# Patient Record
Sex: Female | Born: 1980
Health system: Southern US, Community
[De-identification: ages and names within clinical notes are randomized; demographics above are authoritative.]

---

## 2018-08-27 ENCOUNTER — Emergency Department (HOSPITAL_COMMUNITY): Payer: Managed Care, Other (non HMO)

## 2018-08-27 ENCOUNTER — Encounter (HOSPITAL_COMMUNITY): Payer: Self-pay

## 2018-08-27 ENCOUNTER — Emergency Department (HOSPITAL_COMMUNITY)
Admission: EM | Admit: 2018-08-27 | Discharge: 2018-08-27 | Disposition: A | Discharge status: Home / Self Care | Payer: Managed Care, Other (non HMO) | Attending: Emergency Medicine | Admitting: Emergency Medicine

## 2018-08-27 ENCOUNTER — Other Ambulatory Visit: Payer: Self-pay

## 2018-08-27 DIAGNOSIS — Z79899 Other long term (current) drug therapy: Secondary | ICD-10-CM | POA: Insufficient documentation

## 2018-08-27 DIAGNOSIS — B9689 Other specified bacterial agents as the cause of diseases classified elsewhere: Secondary | ICD-10-CM | POA: Insufficient documentation

## 2018-08-27 DIAGNOSIS — R739 Hyperglycemia, unspecified: Secondary | ICD-10-CM | POA: Diagnosis not present

## 2018-08-27 DIAGNOSIS — N760 Acute vaginitis: Secondary | ICD-10-CM | POA: Diagnosis not present

## 2018-08-27 DIAGNOSIS — R1031 Right lower quadrant pain: Secondary | ICD-10-CM | POA: Insufficient documentation

## 2018-08-27 DIAGNOSIS — R102 Pelvic and perineal pain: Secondary | ICD-10-CM | POA: Diagnosis present

## 2018-08-27 LAB — URINALYSIS, ROUTINE W REFLEX MICROSCOPIC
Bilirubin Urine: NEGATIVE
Glucose, UA: NEGATIVE mg/dL
Ketones, ur: 80 mg/dL — AB
Leukocytes,Ua: NEGATIVE
Nitrite: NEGATIVE
Protein, ur: NEGATIVE mg/dL
Specific Gravity, Urine: 1.014 (ref 1.005–1.030)
pH: 6 (ref 5.0–8.0)

## 2018-08-27 LAB — CBC WITH DIFFERENTIAL/PLATELET
Abs Immature Granulocytes: 0.21 10*3/uL — ABNORMAL HIGH (ref 0.00–0.07)
Basophils Absolute: 0 10*3/uL (ref 0.0–0.1)
Basophils Relative: 0 %
Eosinophils Absolute: 0 10*3/uL (ref 0.0–0.5)
Eosinophils Relative: 0 %
HCT: 35.5 % — ABNORMAL LOW (ref 36.0–46.0)
Hemoglobin: 11.3 g/dL — ABNORMAL LOW (ref 12.0–15.0)
Immature Granulocytes: 1 %
Lymphocytes Relative: 6 %
Lymphs Abs: 1.2 10*3/uL (ref 0.7–4.0)
MCH: 28.2 pg (ref 26.0–34.0)
MCHC: 31.8 g/dL (ref 30.0–36.0)
MCV: 88.5 fL (ref 80.0–100.0)
Monocytes Absolute: 2 10*3/uL — ABNORMAL HIGH (ref 0.1–1.0)
Monocytes Relative: 10 %
Neutro Abs: 17.4 10*3/uL — ABNORMAL HIGH (ref 1.7–7.7)
Neutrophils Relative %: 83 %
Platelets: 266 10*3/uL (ref 150–400)
RBC: 4.01 MIL/uL (ref 3.87–5.11)
RDW: 14.1 % (ref 11.5–15.5)
WBC: 20.8 10*3/uL — ABNORMAL HIGH (ref 4.0–10.5)
nRBC: 0 % (ref 0.0–0.2)

## 2018-08-27 LAB — COMPREHENSIVE METABOLIC PANEL
ALT: 16 U/L (ref 0–44)
AST: 22 U/L (ref 15–41)
Albumin: 3.1 g/dL — ABNORMAL LOW (ref 3.5–5.0)
Alkaline Phosphatase: 51 U/L (ref 38–126)
Anion gap: 9 (ref 5–15)
BUN: 7 mg/dL (ref 6–20)
CO2: 20 mmol/L — ABNORMAL LOW (ref 22–32)
Calcium: 7.9 mg/dL — ABNORMAL LOW (ref 8.9–10.3)
Chloride: 106 mmol/L (ref 98–111)
Creatinine, Ser: 0.62 mg/dL (ref 0.44–1.00)
GFR calc Af Amer: >60 mL/min (ref >60)
GFR calc non Af Amer: >60 mL/min (ref >60)
Glucose, Bld: 117 mg/dL — ABNORMAL HIGH (ref 70–99)
Potassium: 3.4 mmol/L — ABNORMAL LOW (ref 3.5–5.1)
Sodium: 135 mmol/L (ref 135–145)
Total Bilirubin: 1.5 mg/dL — ABNORMAL HIGH (ref 0.3–1.2)
Total Protein: 7.4 g/dL (ref 6.5–8.1)

## 2018-08-27 LAB — I-STAT BETA HCG BLOOD, ED (MC, WL, AP ONLY): I-stat hCG, quantitative: 11.7 m[IU]/mL — ABNORMAL HIGH (ref <5)

## 2018-08-27 LAB — WET PREP, GENITAL
Sperm: NONE SEEN
Trich, Wet Prep: NONE SEEN
Yeast Wet Prep HPF POC: NONE SEEN

## 2018-08-27 LAB — HCG, QUANTITATIVE, PREGNANCY: hCG, Beta Chain, Quant, S: <1 m[IU]/mL (ref <5)

## 2018-08-27 MED ORDER — ONDANSETRON HCL 4 MG/2ML IJ SOLN
4.0000 mg | Freq: Once | INTRAMUSCULAR | Status: AC
  Administered 2018-08-27: 4 mg via INTRAVENOUS
  Filled 2018-08-27: qty 2

## 2018-08-27 MED ORDER — HYDROMORPHONE HCL 1 MG/ML IJ SOLN
1.0000 mg | Freq: Once | INTRAMUSCULAR | Status: AC
  Administered 2018-08-27: 13:00:00 1 mg via INTRAVENOUS
  Filled 2018-08-27: qty 1

## 2018-08-27 MED ORDER — SODIUM CHLORIDE (PF) 0.9 % IJ SOLN
INTRAMUSCULAR | Status: AC
  Filled 2018-08-27: qty 50

## 2018-08-27 MED ORDER — SODIUM CHLORIDE 0.9 % IV BOLUS
1000.0000 mL | Freq: Once | INTRAVENOUS | Status: AC
  Administered 2018-08-27: 1000 mL via INTRAVENOUS

## 2018-08-27 MED ORDER — STERILE WATER FOR INJECTION IJ SOLN
INTRAMUSCULAR | Status: AC
  Administered 2018-08-27: 2.1 mL
  Filled 2018-08-27: qty 10

## 2018-08-27 MED ORDER — MORPHINE SULFATE (PF) 4 MG/ML IV SOLN
4.0000 mg | Freq: Once | INTRAVENOUS | Status: AC
  Administered 2018-08-27: 4 mg via INTRAVENOUS
  Filled 2018-08-27: qty 1

## 2018-08-27 MED ORDER — DOXYCYCLINE HYCLATE 100 MG PO TABS
100.0000 mg | ORAL_TABLET | Freq: Once | ORAL | Status: AC
  Administered 2018-08-27: 13:00:00 100 mg via ORAL
  Filled 2018-08-27: qty 1

## 2018-08-27 MED ORDER — ONDANSETRON 4 MG PO TBDP: 4.0000 mg | ORAL_TABLET | Freq: Three times a day (TID) | ORAL | 0 refills | Status: AC | PRN

## 2018-08-27 MED ORDER — DOXYCYCLINE HYCLATE 100 MG PO CAPS: 100.0000 mg | ORAL_CAPSULE | Freq: Two times a day (BID) | ORAL | 0 refills | Status: AC

## 2018-08-27 MED ORDER — IOHEXOL 300 MG/ML  SOLN
100.0000 mL | Freq: Once | INTRAMUSCULAR | Status: AC | PRN
  Administered 2018-08-27: 100 mL via INTRAVENOUS

## 2018-08-27 MED ORDER — HYDROCODONE-ACETAMINOPHEN 5-325 MG PO TABS: 1.0000 | ORAL_TABLET | ORAL | 0 refills | Status: AC | PRN

## 2018-08-27 MED ORDER — CEFTRIAXONE SODIUM 1 G IJ SOLR
1.0000 g | Freq: Once | INTRAMUSCULAR | Status: AC
  Administered 2018-08-27: 1 g via INTRAMUSCULAR
  Filled 2018-08-27: qty 10

## 2018-08-27 MED ORDER — METRONIDAZOLE 500 MG PO TABS: 500.0000 mg | ORAL_TABLET | Freq: Two times a day (BID) | ORAL | 0 refills | Status: AC

## 2018-08-27 NOTE — ED Notes (Signed)
Pt d/c home per MD order. Discharge summary reviewed with pt, pt verbalizes understanding. Voicing no complaints at discharge, Reports she is calling an Mel[facility] Popper for way home.

## 2018-08-27 NOTE — ED Notes (Signed)
Pt encouraged to provide urine specimen per MD order.  

## 2018-08-27 NOTE — ED Provider Notes (Addendum)
[facility] DEPT Provider Note   CSN: [id number] Arrival date & time: 08/27/18  0522  History   Chief Complaint Chief Complaint  Patient presents with   Pelvic Pain   HPI Amy Chen is a 38 y.o. female with no significant past medical history who presents for evaluation of pelvic pain.  Patient states she has had right lower quadrant pain as well as some mild suprapubic pain over the last 2 days.  She has been taking ibuprofen without relief of her symptoms.  Symptoms have progressively worsened.  They are nonradiating.  Has had some mild nausea without emesis.  Denies fever, chills, chest pain, shortness of breath, congestion, rhinorrhea, dysuria, diarrhea, constipation, vaginal discharge, concerns for STDs.  She was last sexually active in March.  She does use protection.  Per triage note nursing states she had a prior hysterectomy however patient denies this.  She denies any prior abdominal surgeries.  Rates her current pain a 10/10.  Described as throbbing, aching, stabbing.  Denies additional aggravating or alleviating factors.  Prior Abd surgeries-- None Last PO intake solid food-- 8 PM yesterday   History obtained from patient and past medical records.  Interpreter was used.     HPI  History reviewed. No pertinent past medical history.  There are no active problems to display for this patient.   History reviewed. No pertinent surgical history.   OB History   No obstetric history on file.      Home Medications    Prior to Admission medications   Medication Sig Start Date End Date Taking? Authorizing Provider  acetaminophen (TYLENOL) 325 MG tablet Take 975 mg by mouth every 6 (six) hours as needed for mild pain, moderate pain or headache.   Yes [provider]  ibuprofen (ADVIL) 200 MG tablet Take 400 mg by mouth every 6 (six) hours as needed for fever, headache or moderate pain.   Yes [provider]  Multiple  Vitamin (MULTIVITAMIN WITH MINERALS) TABS tablet Take 1 tablet by mouth daily.   Yes [provider]  doxycycline (VIBRAMYCIN) 100 MG capsule Take 1 capsule (100 mg total) by mouth 2 (two) times daily for 14 days. 08/27/18 09/10/18  Kattia Selley A, PA-C  HYDROcodone-acetaminophen (NORCO/VICODIN) 5-325 MG tablet Take 1 tablet by mouth every 4 (four) hours as needed. 08/27/18   Khadijatou Borak A, PA-C  metroNIDAZOLE (FLAGYL) 500 MG tablet Take 1 tablet (500 mg total) by mouth 2 (two) times daily. 08/27/18   Welles Walthall A, PA-C  ondansetron (ZOFRAN ODT) 4 MG disintegrating tablet Take 1 tablet (4 mg total) by mouth every 8 (eight) hours as needed for nausea or vomiting. 08/27/18   Lucciana Head A, PA-C    Family History No family history on file.  Social History Social History   Tobacco Use   Smoking status: Not on file  Substance Use Topics   Alcohol use: Not on file   Drug use: Not on file     Allergies   Patient has no known allergies.   Review of Systems Review of Systems  Constitutional: Negative.   HENT: Negative.   Respiratory: Negative.   Cardiovascular: Negative.   Gastrointestinal: Positive for abdominal pain and nausea. Negative for anal bleeding, blood in stool, constipation, diarrhea, rectal pain and vomiting.  Genitourinary: Positive for pelvic pain. Negative for decreased urine volume, difficulty urinating, dysuria, flank pain, frequency, hematuria, menstrual problem, urgency, vaginal bleeding, vaginal discharge and vaginal pain.  Musculoskeletal: Negative.  Skin: Negative.   Neurological: Negative.   All other systems reviewed and are negative.   Physical Exam Updated Vital Signs BP (!) 106/53    Pulse 86    Temp 98.2 F (36.8 C) (Oral)    Resp 16    Ht 5\' 7"  (1.702 m)    Wt 127 kg    LMP 07/25/2018    SpO2 99%    BMI 43.85 kg/m   Physical Exam Vitals signs and nursing note reviewed.  Constitutional:      General: She is not in acute  distress.    Appearance: She is well-developed. She is not ill-appearing, toxic-appearing or diaphoretic.  HENT:     Head: Normocephalic and atraumatic.     Nose: Nose normal.     Mouth/Throat:     Mouth: Mucous membranes are moist.     Pharynx: Oropharynx is clear.  Eyes:     Pupils: Pupils are equal, round, and reactive to light.  Neck:     Musculoskeletal: Normal range of motion.  Cardiovascular:     Rate and Rhythm: Normal rate.     Pulses: Normal pulses.          Dorsalis pedis pulses are 2+ on the right side and 2+ on the left side.       Posterior tibial pulses are 2+ on the right side and 2+ on the left side.     Heart sounds: Normal heart sounds.  Pulmonary:     Effort: Pulmonary effort is normal. No respiratory distress.     Breath sounds: Normal breath sounds.  Abdominal:     General: Bowel sounds are normal. There is no distension.     Palpations: Abdomen is soft.     Tenderness: There is abdominal tenderness in the right lower quadrant and suprapubic area. There is no right CVA tenderness, left CVA tenderness, guarding or rebound. Negative signs include Murphy's sign, Rovsing's sign, McBurney's sign, psoas sign and obturator sign.     Hernia: No hernia is present.       Comments: Soft without rebound or guarding.  She has tenderness to right lower quadrant and suprapubic region. Negative psoas, obturator sign. No tenderness at [place]. No overlying skin changes.  Genitourinary:    Comments: Normal appearing external female genitalia without rashes or lesions, normal vaginal epithelium. Normal appearing cervix with mild thin white discharge in vaginal vault. No cervical petechiae or friable cervix. Cervical os is closed. There is no bleeding noted at the os. No Odor. Bimanual: Mild CMT, mild right adenexal tenderness, No left adenexal tenderness. Fibroid type uterus.  No palpable adnexal masses or tenderness. Uterus midline and not fixed. Rectovaginal exam was  deferred.  No cystocele or rectocele noted. No pelvic lymphadenopathy noted. Wet prep was obtained.  Cultures for gonorrhea and chlamydia collected. Exam performed with chaperone, Nurse  in room. Musculoskeletal: Normal range of motion.     Comments: Moves all 4 extremities no difficulty.  Skin:    General: Skin is warm and dry.     Comments: No rashes or lesions.  No erythema, warmth or edema.  Neurological:     Mental Status: She is alert.     Comments: Cranial nerves II through XII grossly intact.  No facial droop.  Ambulatory that difficulty.    ED Treatments / Results  Labs (all labs ordered are listed, but only abnormal results are displayed) Labs Reviewed  WET PREP, GENITAL - Abnormal; Notable for the following components:  Result Value   Clue Cells Wet Prep HPF POC PRESENT (*)    WBC, Wet Prep HPF POC FEW (*)    All other components within normal limits  CBC WITH DIFFERENTIAL/PLATELET - Abnormal; Notable for the following components:   WBC 20.8 (*)    Hemoglobin 11.3 (*)    HCT 35.5 (*)    Neutro Abs 17.4 (*)    Monocytes Absolute 2.0 (*)    Abs Immature Granulocytes 0.21 (*)    All other components within normal limits  COMPREHENSIVE METABOLIC PANEL - Abnormal; Notable for the following components:   Potassium 3.4 (*)    CO2 20 (*)    Glucose, Bld 117 (*)    Calcium 7.9 (*)    Albumin 3.1 (*)    Total Bilirubin 1.5 (*)    All other components within normal limits  URINALYSIS, ROUTINE W REFLEX MICROSCOPIC - Abnormal; Notable for the following components:   Hgb urine dipstick SMALL (*)    Ketones, ur 80 (*)    Bacteria, UA RARE (*)    All other components within normal limits  I-STAT BETA HCG BLOOD, ED ([facility], [facility], [facility] ONLY) - Abnormal; Notable for the following components:   I-stat hCG, quantitative 11.7 (*)    All other components within normal limits  URINE CULTURE  HCG, QUANTITATIVE, PREGNANCY  RPR  HIV ANTIBODY (ROUTINE TESTING W REFLEX)  GC/CHLAMYDIA PROBE  AMP ([place]) NOT AT [facility]    EKG None  Radiology [place] Transvaginal Non-ob  Result Date: 08/27/2018 CLINICAL DATA:  RIGHT pelvic pain EXAM: TRANSABDOMINAL AND TRANSVAGINAL ULTRASOUND OF PELVIS DOPPLER ULTRASOUND OF OVARIES TECHNIQUE: Both transabdominal and transvaginal ultrasound examinations of the pelvis were performed. Transabdominal technique was performed for global imaging of the pelvis including uterus, ovaries, adnexal regions, and pelvic cul-de-sac. It was necessary to proceed with endovaginal exam following the transabdominal exam to visualize the RIGHT ovary and endometrium. Color and duplex Doppler ultrasound was utilized to evaluate blood flow to the ovaries. COMPARISON:  None FINDINGS: Uterus Measurements: 12.2 x 8.8 x 7.8 cm = volume: 441 mL. Large mass at uterine fundus 7.5 x 7.3 x 7.4 cm likely representing a heterogeneous leiomyoma. This is transmural and extends submucosal at the upper uterine segment. Endometrium Thickness: 4 mm.  No endometrial fluid or focal abnormality Right ovary Measurements: 2.5 x 3.7 x 1.5 cm = volume: 7.4 mL. Normal morphology without mass. Internal blood flow present on color Doppler imaging. Left ovary No normal appearing LEFT ovary visualized. LEFT adnexal mass identified, see below. Pulsed Doppler evaluation of the RIGHT ovary demonstrates normal low-resistance arterial and venous waveforms. Other findings No abnormal free fluid. Complex mass identified in LEFT adnexa, 6.3 x 6.2 x 6.6 cm in size, containing a large hyperechoic nodular focus 4.7 cm greatest diameter and surrounding complex hypoechoic regions. 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 No normal appearing LEFT ovarian tissue is visualized. Blood flow is detected within the margin of the mass on color Doppler imaging, with arterial and venous waveforms identified on pulsed Doppler imaging. No additional pelvic masses. IMPRESSION: Large probable transmural leiomyoma at  uterine fundus 7.5 cm greatest size. Normal appearing RIGHT ovary without evidence of ovarian torsion. 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 Electronically Signed   By: Ulyses SouthwardMark  Boles M.D.   On: 08/27/2018 11:18   [place] Pelvis Complete  Result Date:  08/27/2018 CLINICAL DATA:  RIGHT pelvic pain EXAM: TRANSABDOMINAL AND TRANSVAGINAL ULTRASOUND OF PELVIS DOPPLER ULTRASOUND OF OVARIES TECHNIQUE: Both transabdominal and transvaginal ultrasound examinations of the pelvis were performed. Transabdominal technique was performed for global imaging of the pelvis including uterus, ovaries, adnexal regions, and pelvic cul-de-sac. It was necessary to proceed with endovaginal exam following the transabdominal exam to visualize the RIGHT ovary and endometrium. Color and duplex Doppler ultrasound was utilized to evaluate blood flow to the ovaries. COMPARISON:  None FINDINGS: Uterus Measurements: 12.2 x 8.8 x 7.8 cm = volume: 441 mL. Large mass at uterine fundus 7.5 x 7.3 x 7.4 cm likely representing a heterogeneous leiomyoma. This is transmural and extends submucosal at the upper uterine segment. Endometrium Thickness: 4 mm.  No endometrial fluid or focal abnormality Right ovary Measurements: 2.5 x 3.7 x 1.5 cm = volume: 7.4 mL. Normal morphology without mass. Internal blood flow present on color Doppler imaging. Left ovary No normal appearing LEFT ovary visualized. LEFT adnexal mass identified, see below. Pulsed Doppler evaluation of the RIGHT ovary demonstrates normal low-resistance arterial and venous waveforms. Other findings No abnormal free fluid. Complex mass identified in LEFT adnexa, 6.3 x 6.2 x 6.6 cm in size, containing a large hyperechoic nodular focus 4.7 cm greatest diameter and surrounding complex hypoechoic regions. 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 No normal appearing LEFT  ovarian tissue is visualized. Blood flow is detected within the margin of the mass on color Doppler imaging, with arterial and venous waveforms identified on pulsed Doppler imaging. No additional pelvic masses. IMPRESSION: Large probable transmural leiomyoma at uterine fundus 7.5 cm greatest size. Normal appearing RIGHT ovary without evidence of ovarian torsion. 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 Electronically Signed   By: Ulyses SouthwardMark  Boles M.D.   On: 08/27/2018 11:18   Ct Abdomen Pelvis W Contrast  Result Date: 08/27/2018 CLINICAL DATA:  Abdominal pain, primarily lower abdomen EXAM: CT ABDOMEN AND PELVIS WITH CONTRAST TECHNIQUE: Multidetector CT imaging of the abdomen and pelvis was performed using the standard protocol following bolus administration of intravenous contrast. CONTRAST:  100mL OMNIPAQUE IOHEXOL 300 MG/ML  SOLN COMPARISON:  None. FINDINGS: Lower chest: There is mild bibasilar atelectasis. There is no lung base edema or consolidation. Hepatobiliary: No focal liver lesions are evident. Gallbladder wall is not appreciably thickened. There is no biliary duct dilatation. Pancreas: There is no pancreatic mass or inflammatory focus. Spleen: No splenic lesions are evident. Adrenals/Urinary Tract: Adrenals bilaterally appear normal. Kidneys bilaterally show no evident mass or hydronephrosis on either side. There is a 2 mm calculus in the upper pole of the left kidney. No intrarenal calculi are noted on the right. There is no evident ureteral calculus on either side. Urinary bladder is midline with wall thickness within normal limits. There is slight prominence of the mid right ureter due to right ureter being impressed upon by a large uterine mass. Stomach/Bowel: There is no appreciable bowel wall or mesenteric thickening. There is no evident bowel obstruction. Terminal ileum appears normal.  There is no evident free air or portal venous air. Vascular/Lymphatic: No abdominal aortic aneurysm. No vascular lesions evident. There are scattered subcentimeter retroperitoneal lymph nodes. No frank adenopathy is evident by size criteria on this study. Reproductive: Uterus is anteverted. There is a mass arising from the uterus which has a decreased attenuation central region with a thickened wall slightly  enhancing periphery. This dominant mass arising from the uterus measures 10.8 x 9.3 x 8.7 cm. There is an apparent dermoid arising from the left adnexa containing fat and non-specific soft tissue material. This left ovarian dermoid measures 6.8 x 6.4 x 6.3 cm. No other pelvic masses are evident. Other: The appendix appears unremarkable. No abscess or ascites is evident in the abdomen or pelvis. There is a lipoma immediately superior to the umbilicus in the anterior abdominal wall measuring 2.9 x 2.6 x 4.3 cm. Musculoskeletal: No blastic or lytic bone lesions. No intramuscular lesions are evident. IMPRESSION: 1. Dominant mass arising from the anterior uterus measuring 10.8 x 9.3 x 8.7 cm. This mass has a decreased attenuation central region with an enhancing thick-walled peripheral region. Suspect slightly atypical leiomyoma. 2. Left ovarian dermoid measuring 6.8 x 6.4 x 6.3 cm, containing fat and nonspecific solid material. 3. Appendix appears normal. No bowel obstruction. No abscess in the abdomen or pelvis. 4. 2 mm calculus upper pole left kidney. No hydronephrosis or ureteral calculus on either side. Note that the right ureter is impressed upon by the dominant uterine mass. Urinary bladder wall thickness normal. 5. Lipoma in the anterior abdominal wall slightly superior to the umbilicus measuring 2.9 x 2.6 x 4.3 cm. Comment: It may be prudent to correlate with pelvic ultrasound including Doppler interrogation to exclude the possibility of torsion as a differential consideration for the patient's pain.  Electronically Signed   By: Bretta Bang III M.D.   On: 08/27/2018 11:30   [place] Art/ven Flow Abd Pelv Doppler  Result Date: 08/27/2018 CLINICAL DATA:  RIGHT pelvic pain EXAM: TRANSABDOMINAL AND TRANSVAGINAL ULTRASOUND OF PELVIS DOPPLER ULTRASOUND OF OVARIES TECHNIQUE: Both transabdominal and transvaginal ultrasound examinations of the pelvis were performed. Transabdominal technique was performed for global imaging of the pelvis including uterus, ovaries, adnexal regions, and pelvic cul-de-sac. It was necessary to proceed with endovaginal exam following the transabdominal exam to visualize the RIGHT ovary and endometrium. Color and duplex Doppler ultrasound was utilized to evaluate blood flow to the ovaries. COMPARISON:  None FINDINGS: Uterus Measurements: 12.2 x 8.8 x 7.8 cm = volume: 441 mL. Large mass at uterine fundus 7.5 x 7.3 x 7.4 cm likely representing a heterogeneous leiomyoma. This is transmural and extends submucosal at the upper uterine segment. Endometrium Thickness: 4 mm.  No endometrial fluid or focal abnormality Right ovary Measurements: 2.5 x 3.7 x 1.5 cm = volume: 7.4 mL. Normal morphology without mass. Internal blood flow present on color Doppler imaging. Left ovary No normal appearing LEFT ovary visualized. LEFT adnexal mass identified, see below. Pulsed Doppler evaluation of the RIGHT ovary demonstrates normal low-resistance arterial and venous waveforms. Other findings No abnormal free fluid. Complex mass identified in LEFT adnexa, 6.3 x 6.2 x 6.6 cm in size, containing a large hyperechoic nodular focus 4.7 cm greatest diameter and surrounding complex hypoechoic regions. Legrand Rams this representing a large dermoid tumor of the LEFT ovary. No normal appearing LEFT ovarian tissue is visualized. Blood flow is detected within the margin of the mass on color Doppler imaging, with arterial and venous waveforms identified on pulsed Doppler imaging. No additional pelvic masses. IMPRESSION: Large  probable transmural leiomyoma at uterine fundus 7.5 cm greatest size. Normal appearing RIGHT ovary without evidence of ovarian torsion. 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 Electronically Signed   By: Ulyses Southward M.D.   On: 08/27/2018  11:18    Procedures Procedures (including critical care time)  Medications Ordered in ED Medications  sodium chloride (PF) 0.9 % injection (has no administration in time range)  sodium chloride 0.9 % bolus 1,000 mL (0 mLs Intravenous Stopped 08/27/18 0900)  ondansetron (ZOFRAN) injection 4 mg (4 mg Intravenous Given 08/27/18 0801)  morphine 4 MG/ML injection 4 mg (4 mg Intravenous Given 08/27/18 0802)  iohexol (OMNIPAQUE) 300 MG/ML solution 100 mL (100 mLs Intravenous Contrast Given 08/27/18 1108)  HYDROmorphone (DILAUDID) injection 1 mg (1 mg Intravenous Given 08/27/18 1235)  ondansetron (ZOFRAN) injection 4 mg (4 mg Intravenous Given 08/27/18 1235)  cefTRIAXone (ROCEPHIN) injection 1 g (1 g Intramuscular Given 08/27/18 1239)  doxycycline (VIBRA-TABS) tablet 100 mg (100 mg Oral Given 08/27/18 1235)  sterile water (preservative free) injection (2.1 mLs  Given 08/27/18 1240)   Initial Impression / Assessment and Plan / ED Course  I have reviewed the triage vital signs and the nursing notes.  Pertinent labs & imaging results that were available during my care of the patient were reviewed by me and considered in my medical decision making (see chart for details).  38 year old female appears otherwise well presents for evaluation of right-sided pelvic pain x 2-3 days.  Mildly elevated oral temp on arrival to 100.0 however patient states she had just taken "a few sips of coffee" right before temperature check.  I personally evaluated patient 30 minutes later and oral temperature was 98.5 without any medications. Rectal temp without elevation. Abdomen soft with  mild tenderness to right lower quadrant and suprapubic region. No urinary sx. Tolerating p.o. intake at home without difficulty.  GU exam with minimal cervical motion tenderness however she does have some mild right adnexal tenderness.  Thin white dc in vaginal vault. Will obtain labs, urine and reevaluate.  Nursing is been unable to attain labs from patient or urine at this time.  0930: I-STAT hCG came back at 11.  Discussed with patient results.  Patient states "there is no way Im pregnant  The last time he had sex was in March." Patient seems reliable. Given her low level and last intercourse may be possible false negative.  Will obtain quant hCG. Discussed with patient elevated white blood cell count and concern for possible tubo-ovarian abscess vs appendicitis.  Discussed CT risks given hCG elevated.  She voices understanding and would like to proceed with a CT scan.  Pain currently rated a 6/10.  States she does not want additional pain medicine at this time.  She will call out if needed  Labs and imaging personally reviewed:  CBC with leukocytosis at 20.8, Hgb 11.3 Wet prep with BV, few WBC CMP with mild hypokalemia, elevated glucose, mildly elevated bili however no RUQ pain. No additional LFT elevation  HCg quant: Less than 0--negative (Istat originally positive at 11.7) RPR/HIV pending CG/Chlamydia: pending Urinalysis : negative for infection, will culture  [place]: left cystic lesion and fibroids, No RIGHT pathology, No torsion, TOA CT [facility]: Without acute findings, atypical leiomyoma at uterus, No appendicitis  1200: Pain a 6/10.  Abdomen soft without rebound or guarding.  Very minimal tenderness currently to the right lower quadrant, right pelvis.  1345:Hemodynamically stable.  Only minimal pain currently however non surgical abdomen.  Negative psoas, obturator and Rovsing sign. No RUQ pain to suggest gallbladder pathology. Tolerating p.o. intake without difficulty. Ambulating without  difficulty.  Discussed with patient possible PID versus early appendicitis not found on CT scan. Low suspicion for ovarian torsion (intermittent  torsion as well considered), TOA, appy. Will DC home with antibiotics, pain medicine to cover for possible PID give mild CMT and right adnexal tenderness.  Patient to return for any new or worsening symptoms, specifically to the worsening to the right lower quadrant, fever, emesis.  No evidence of torsion, ovarian cyst, appendicitis, bowel obstruction or perforation, TOA on imaging currently.  Patient is nontoxic, nonseptic appearing, in no apparent distress.  Patient's pain and other symptoms adequately managed in emergency department.  Fluid bolus given.  Labs, imaging and vitals reviewed.  Patient does not meet the SIRS or Sepsis criteria.  On repeat exam patient does not have a surgical abdomin and there are no peritoneal signs.  No indication of appendicitis, bowel obstruction, bowel perforation, cholecystitis, diverticulitis, PID or ectopic pregnancy.  Patient discharged home with symptomatic treatment and given strict instructions for follow-up with their primary care physician.  I have also discussed reasons to return immediately to the ER.  Patient expresses understanding and agrees with plan.        Final Clinical Impressions(s) / ED Diagnoses   Final diagnoses:  BV (bacterial vaginosis)  RLQ abdominal pain  Blood glucose elevated    ED Discharge Orders         Ordered    doxycycline (VIBRAMYCIN) 100 MG capsule  2 times daily     08/27/18 1341    metroNIDAZOLE (FLAGYL) 500 MG tablet  2 times daily     08/27/18 1341    HYDROcodone-acetaminophen (NORCO/VICODIN) 5-325 MG tablet  Every 4 hours PRN     08/27/18 1341    ondansetron (ZOFRAN ODT) 4 MG disintegrating tablet  Every 8 hours PRN     08/27/18 1341           Ceciley Buist A, PA-C 08/27/18 1346    Benjiman CorePickering, Nathan, MD 08/27/18 1511    Daniesha Driver A, PA-C 08/28/18  1647    Benjiman CorePickering, Nathan, MD 08/29/18 1505

## 2018-08-27 NOTE — ED Triage Notes (Addendum)
Pt coming from home c/o pelvic pain x2 days that has gotten progressively worse. Non-radiating. No spotting or discharge. No hx of hysterectomy, no children but states "might have had a cyst before." Last period approx 1 month ago and last BM yesterday. Taken tylenol and ibuprofen with no relief.

## 2018-08-27 NOTE — Discharge Instructions (Addendum)
Take the antibiotics as prescribed.  If you develop worsening symptoms, fever, throwing up despite Zofran please seek reevaluation.

## 2018-08-28 LAB — URINE CULTURE: Culture: <10000 — AB

## 2018-08-28 LAB — HIV ANTIBODY (ROUTINE TESTING W REFLEX): HIV Screen 4th Generation wRfx: NONREACTIVE

## 2018-08-28 LAB — RPR: RPR Ser Ql: NONREACTIVE

## 2018-08-28 LAB — GC/CHLAMYDIA PROBE AMP (~~LOC~~) NOT AT ARMC
Chlamydia: NEGATIVE
Neisseria Gonorrhea: NEGATIVE

## 2018-11-29 ENCOUNTER — Other Ambulatory Visit: Payer: Self-pay

## 2018-11-29 DIAGNOSIS — Z20822 Contact with and (suspected) exposure to covid-19: Secondary | ICD-10-CM

## 2018-12-02 LAB — NOVEL CORONAVIRUS, NAA: SARS-CoV-2, NAA: NOT DETECTED

## 2019-01-02 ENCOUNTER — Other Ambulatory Visit: Payer: Managed Care, Other (non HMO)

## 2019-01-28 ENCOUNTER — Ambulatory Visit: Payer: Managed Care, Other (non HMO) | Attending: Internal Medicine

## 2019-01-28 DIAGNOSIS — Z20822 Contact with and (suspected) exposure to covid-19: Secondary | ICD-10-CM

## 2019-01-29 LAB — NOVEL CORONAVIRUS, NAA: SARS-CoV-2, NAA: NOT DETECTED

## 2019-08-09 ENCOUNTER — Ambulatory Visit: Payer: HRSA Program | Attending: Internal Medicine

## 2019-08-09 DIAGNOSIS — Z20822 Contact with and (suspected) exposure to covid-19: Secondary | ICD-10-CM | POA: Insufficient documentation

## 2019-08-10 LAB — SARS-COV-2, NAA 2 DAY TAT

## 2019-08-10 LAB — NOVEL CORONAVIRUS, NAA: SARS-CoV-2, NAA: NOT DETECTED

## 2019-09-09 ENCOUNTER — Other Ambulatory Visit: Payer: Self-pay

## 2019-10-14 ENCOUNTER — Other Ambulatory Visit: Payer: Self-pay

## 2019-10-14 DIAGNOSIS — Z20822 Contact with and (suspected) exposure to covid-19: Secondary | ICD-10-CM

## 2019-10-15 LAB — SARS-COV-2, NAA 2 DAY TAT

## 2019-10-15 LAB — NOVEL CORONAVIRUS, NAA: SARS-CoV-2, NAA: NOT DETECTED

## 2019-10-21 ENCOUNTER — Other Ambulatory Visit: Payer: Self-pay

## 2019-10-21 DIAGNOSIS — Z20822 Contact with and (suspected) exposure to covid-19: Secondary | ICD-10-CM

## 2019-10-23 LAB — SARS-COV-2, NAA 2 DAY TAT

## 2019-10-23 LAB — NOVEL CORONAVIRUS, NAA: SARS-CoV-2, NAA: NOT DETECTED

## 2019-11-09 ENCOUNTER — Ambulatory Visit: Payer: Self-pay | Attending: Internal Medicine

## 2019-11-09 DIAGNOSIS — Z23 Encounter for immunization: Secondary | ICD-10-CM

## 2019-11-09 NOTE — Progress Notes (Signed)
° °  Covid-19 Vaccination [facility]  Name:  Amy Chen    MRN: [id number] DOB: [birth year]  11/09/2019  Amy Chen was observed post Covid-19 immunization for 15 minutes without incident. She was provided with Vaccine Information Sheet and instruction to access the V-Safe system.   Amy Chen was instructed to call [phone] with any severe reactions post vaccine:  Difficulty breathing   Swelling of face and throat   A fast heartbeat   A bad rash all over body   Dizziness and weakness

## 2019-12-02 ENCOUNTER — Other Ambulatory Visit: Payer: Self-pay

## 2019-12-30 ENCOUNTER — Other Ambulatory Visit: Payer: Self-pay

## 2019-12-30 DIAGNOSIS — Z20822 Contact with and (suspected) exposure to covid-19: Secondary | ICD-10-CM

## 2020-01-01 LAB — SARS-COV-2, NAA 2 DAY TAT

## 2020-01-01 LAB — NOVEL CORONAVIRUS, NAA: SARS-CoV-2, NAA: NOT DETECTED

## 2020-01-02 ENCOUNTER — Other Ambulatory Visit: Payer: Self-pay

## 2020-01-15 ENCOUNTER — Other Ambulatory Visit: Payer: Self-pay

## 2020-01-15 DIAGNOSIS — Z20822 Contact with and (suspected) exposure to covid-19: Secondary | ICD-10-CM

## 2020-01-16 LAB — SARS-COV-2, NAA 2 DAY TAT

## 2020-01-16 LAB — NOVEL CORONAVIRUS, NAA: SARS-CoV-2, NAA: NOT DETECTED

## 2020-01-22 ENCOUNTER — Other Ambulatory Visit: Payer: Self-pay

## 2020-01-22 DIAGNOSIS — Z20822 Contact with and (suspected) exposure to covid-19: Secondary | ICD-10-CM

## 2020-01-24 LAB — NOVEL CORONAVIRUS, NAA: SARS-CoV-2, NAA: NOT DETECTED

## 2020-01-24 LAB — SARS-COV-2, NAA 2 DAY TAT

## 2020-01-30 ENCOUNTER — Other Ambulatory Visit: Payer: Self-pay

## 2020-01-30 DIAGNOSIS — Z20822 Contact with and (suspected) exposure to covid-19: Secondary | ICD-10-CM

## 2020-02-01 LAB — SARS-COV-2, NAA 2 DAY TAT

## 2020-02-01 LAB — NOVEL CORONAVIRUS, NAA: SARS-CoV-2, NAA: NOT DETECTED

## 2020-10-15 IMAGING — CT CT ABDOMEN AND PELVIS WITH CONTRAST
1 of 3 series · 12 of 32 positions shown, 17 images · IV contrast (OMNIPAQUE 300)
Comparison: None.

CLINICAL DATA: Abdominal pain, primarily lower abdomen

EXAM:
CT ABDOMEN AND PELVIS WITH CONTRAST
TECHNIQUE: Multidetector CT imaging of the abdomen and pelvis was performed
using the standard protocol following bolus administration of
intravenous contrast.
CONTRAST:  100mL OMNIPAQUE IOHEXOL 300 MG/ML  SOLN

[Series 2: axial st · axial · 0.73mm/px · z∈[-673,-288]mm · 12 of 89 slices shown, 17 images]
[im 6/89  soft-tissue]
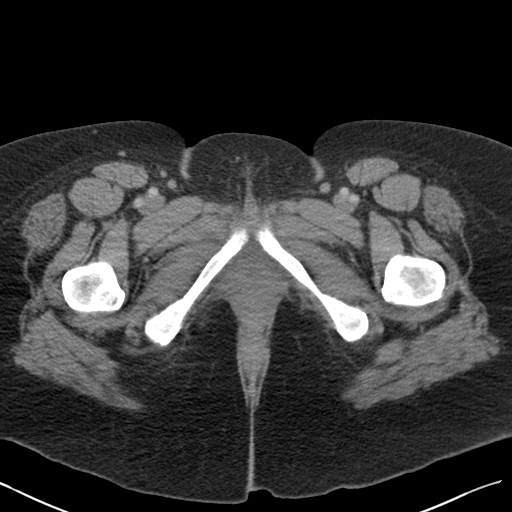
[im 6/89  bone]
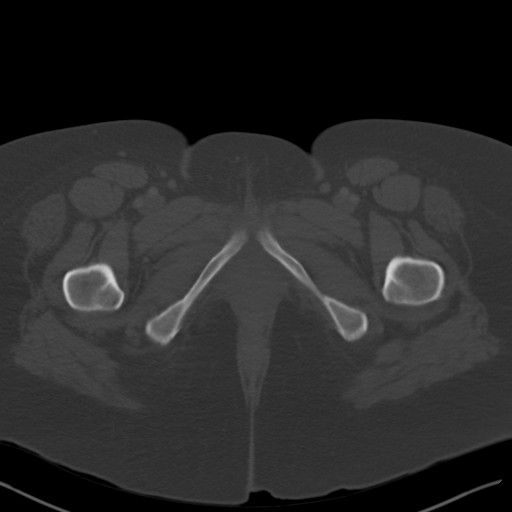
[im 17/89  soft-tissue]
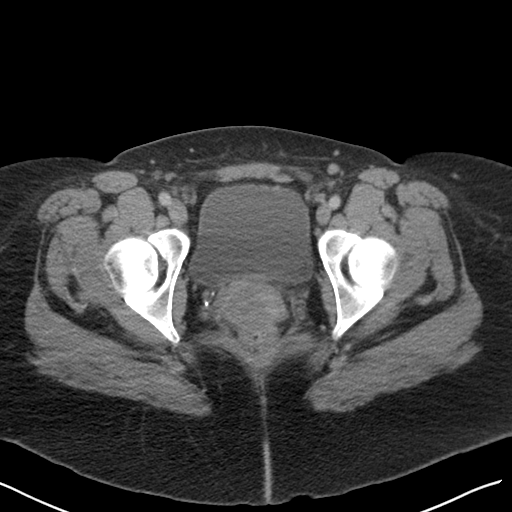
[im 23/89  soft-tissue]
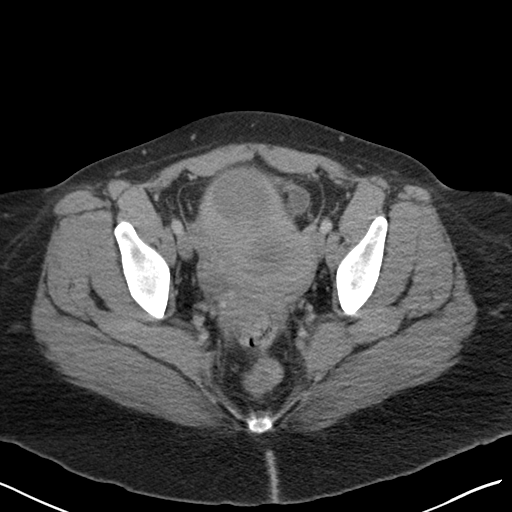
[im 28/89  soft-tissue]
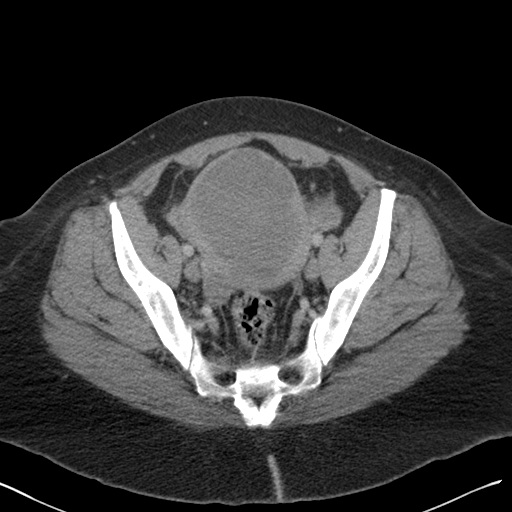
[im 39/89  soft-tissue]
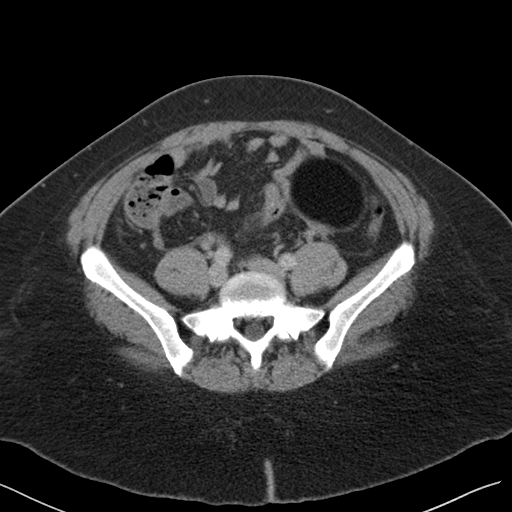
[im 45/89  soft-tissue]
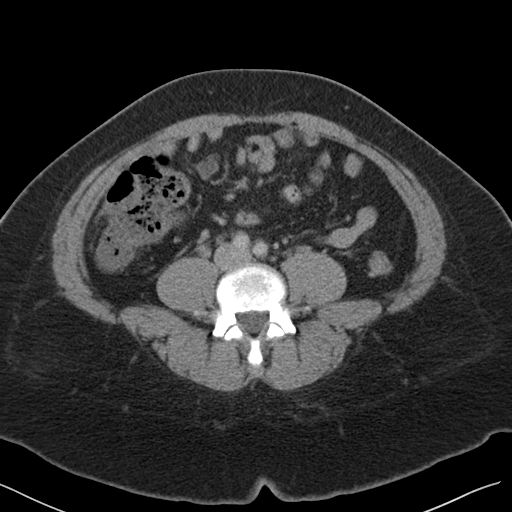
[im 50/89  soft-tissue]
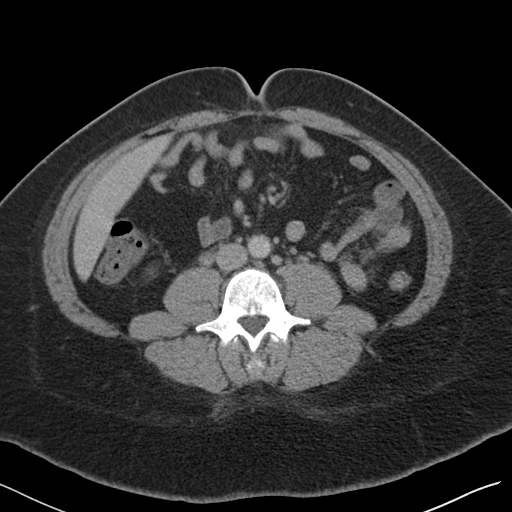
[im 61/89  soft-tissue]
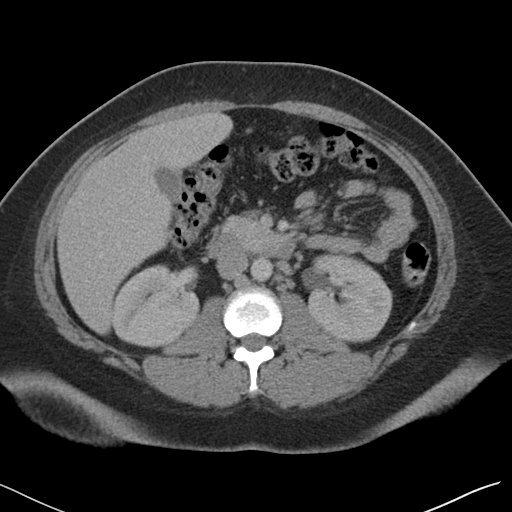
[im 67/89  soft-tissue]
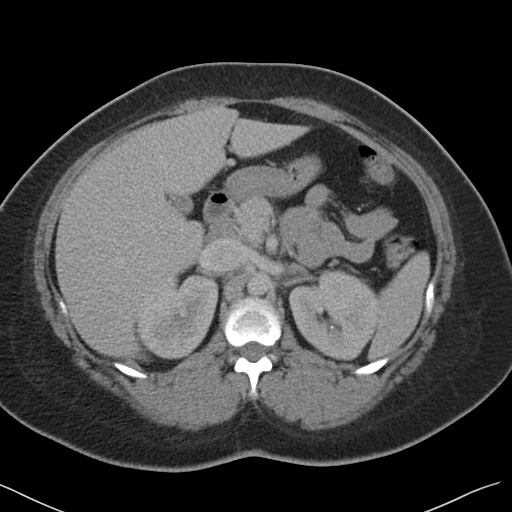
[im 67/89  lung]
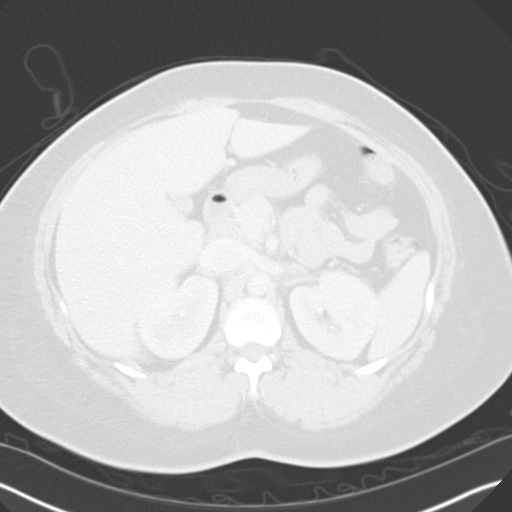
[im 67/89  bone]
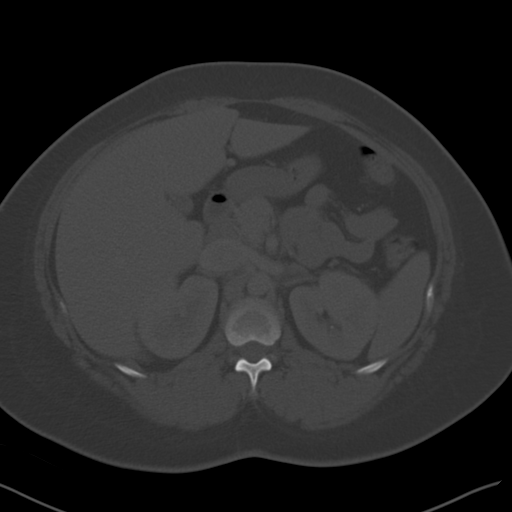
[im 72/89  soft-tissue]
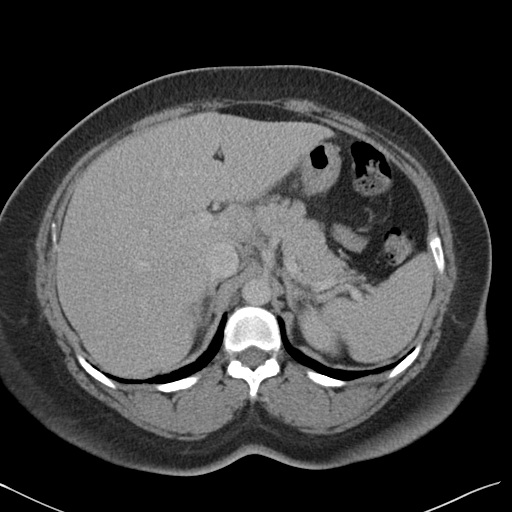
[im 72/89  lung]
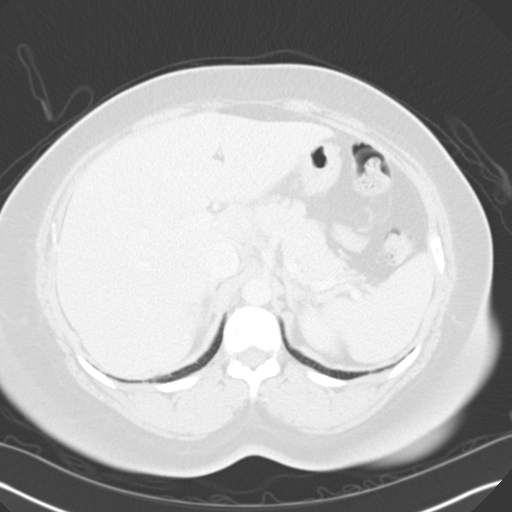
[im 78/89  lung]
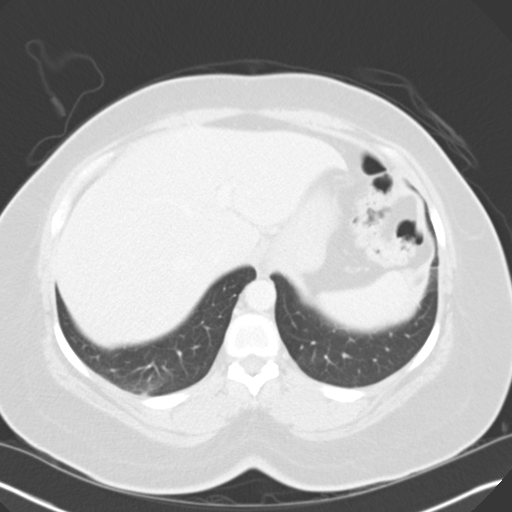
[im 83/89  soft-tissue]
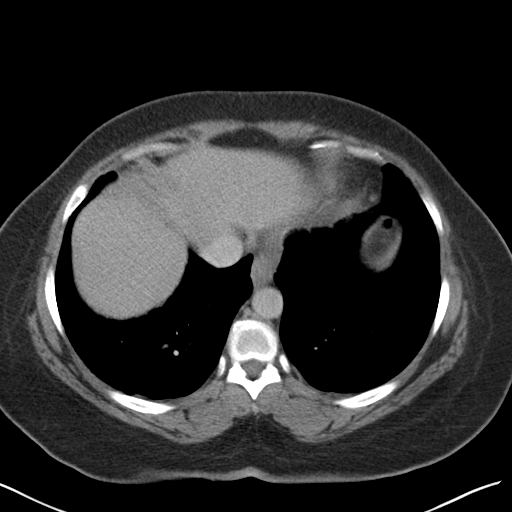
[im 83/89  lung]
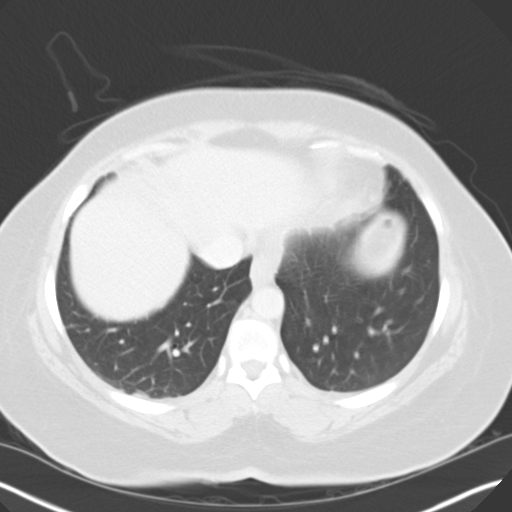

[12 of 32 positions shown; findings below may reference images not displayed]

FINDINGS: Lower chest: There is mild bibasilar atelectasis. There is no lung
base edema or consolidation.

Hepatobiliary: No focal liver lesions are evident. Gallbladder wall
is not appreciably thickened. There is no biliary duct dilatation.

Pancreas: There is no pancreatic mass or inflammatory focus.

Spleen: No splenic lesions are evident.

Adrenals/Urinary Tract: Adrenals bilaterally appear normal. Kidneys
bilaterally show no evident mass or hydronephrosis on either side.
There is a 2 mm calculus in the upper pole of the left kidney. No
intrarenal calculi are noted on the right. There is no evident
ureteral calculus on either side. Urinary bladder is midline with
wall thickness within normal limits. There is slight prominence of
the mid right ureter due to right ureter being impressed upon by a
large uterine mass.

Stomach/Bowel: There is no appreciable bowel wall or mesenteric
thickening. There is no evident bowel obstruction. Terminal ileum
appears normal. There is no evident free air or portal venous air.

Vascular/Lymphatic: No abdominal aortic aneurysm. No vascular
lesions evident. There are scattered subcentimeter retroperitoneal
lymph nodes. No frank adenopathy is evident by size criteria on this
study.

Reproductive: Uterus is anteverted. There is a mass arising from the
uterus which has a decreased attenuation central region with a
thickened wall slightly enhancing periphery. This dominant mass
arising from the uterus measures 10.8 x 9.3 x 8.7 cm. There is an
apparent dermoid arising from the left adnexa containing fat and
non-specific soft tissue material. This left ovarian dermoid
measures 6.8 x 6.4 x 6.3 cm. No other pelvic masses are evident.

Other: The appendix appears unremarkable. No abscess or ascites is
evident in the abdomen or pelvis.

There is a lipoma immediately superior to the umbilicus in the
anterior abdominal wall measuring 2.9 x 2.6 x 4.3 cm.

Musculoskeletal: No blastic or lytic bone lesions. No intramuscular
lesions are evident.
IMPRESSION: 1. Dominant mass arising from the anterior uterus measuring 10.8 x
9.3 x 8.7 cm. This mass has a decreased attenuation central region
with an enhancing thick-walled peripheral region. Suspect slightly
atypical leiomyoma.

2. Left ovarian dermoid measuring 6.8 x 6.4 x 6.3 cm, containing fat
and nonspecific solid material.

3. Appendix appears normal. No bowel obstruction. No abscess in the
abdomen or pelvis.

4. 2 mm calculus upper pole left kidney. No hydronephrosis or
ureteral calculus on either side. Note that the right ureter is
impressed upon by the dominant uterine mass. Urinary bladder wall
thickness normal.

5. Lipoma in the anterior abdominal wall slightly superior to the
umbilicus measuring 2.9 x 2.6 x 4.3 cm.

Comment: It may be prudent to correlate with pelvic ultrasound
including Doppler interrogation to exclude the possibility of
torsion as a differential consideration for the patient's pain.

## 2020-10-26 ENCOUNTER — Emergency Department (HOSPITAL_COMMUNITY): Payer: Self-pay

## 2020-10-26 ENCOUNTER — Emergency Department (HOSPITAL_COMMUNITY)
Admission: EM | Admit: 2020-10-26 | Discharge: 2020-10-27 | Disposition: A | Discharge status: Home / Self Care | Payer: Self-pay | Attending: Emergency Medicine | Admitting: Emergency Medicine

## 2020-10-26 ENCOUNTER — Encounter (HOSPITAL_COMMUNITY): Payer: Self-pay

## 2020-10-26 ENCOUNTER — Other Ambulatory Visit: Payer: Self-pay

## 2020-10-26 DIAGNOSIS — R079 Chest pain, unspecified: Secondary | ICD-10-CM | POA: Insufficient documentation

## 2020-10-26 LAB — BASIC METABOLIC PANEL
Anion gap: 8 (ref 5–15)
BUN: 10 mg/dL (ref 6–20)
CO2: 24 mmol/L (ref 22–32)
Calcium: 9.1 mg/dL (ref 8.9–10.3)
Chloride: 103 mmol/L (ref 98–111)
Creatinine, Ser: 0.65 mg/dL (ref 0.44–1.00)
GFR, Estimated: >60 mL/min (ref >60)
Glucose, Bld: 119 mg/dL — ABNORMAL HIGH (ref 70–99)
Potassium: 3.7 mmol/L (ref 3.5–5.1)
Sodium: 135 mmol/L (ref 135–145)

## 2020-10-26 LAB — CBC
HCT: 38.3 % (ref 36.0–46.0)
Hemoglobin: 12.2 g/dL (ref 12.0–15.0)
MCH: 27.5 pg (ref 26.0–34.0)
MCHC: 31.9 g/dL (ref 30.0–36.0)
MCV: 86.3 fL (ref 80.0–100.0)
Platelets: 378 10*3/uL (ref 150–400)
RBC: 4.44 MIL/uL (ref 3.87–5.11)
RDW: 14.9 % (ref 11.5–15.5)
WBC: 8.3 10*3/uL (ref 4.0–10.5)
nRBC: 0 % (ref 0.0–0.2)

## 2020-10-26 LAB — TROPONIN I (HIGH SENSITIVITY): Troponin I (High Sensitivity): 2 ng/L (ref <18)

## 2020-10-26 LAB — I-STAT BETA HCG BLOOD, ED (MC, WL, AP ONLY): I-stat hCG, quantitative: <5 m[IU]/mL (ref <5)

## 2020-10-26 NOTE — ED Triage Notes (Signed)
Patient has been having chest pain since last Sunday. Just got off a plane today and the chest pain has been very sharp. Patient says she can't catch her breath. Denies cardiac issues in the past.

## 2020-10-26 NOTE — ED Provider Notes (Signed)
Emergency Medicine Provider Triage Evaluation Note  Britta Louth , a 40 y.o. female  was evaluated in triage.  Pt complains of pain in the right of her chest that has been coming and going since October 9..  No history of ACS. Review of Systems  Positive: Chest pain and shortness of breath Negative: Syncope  Physical Exam  BP (!) 149/96 (BP Location: Right Arm)   Pulse 75   Temp 98.6 F (37 C) (Oral)   Resp 19   Ht 5\' 7"  (1.702 m)   Wt 128 kg   SpO2 96%   BMI 44.20 kg/m  Gen:   Awake, no distress   Resp:  Normal effort  MSK:   Moves extremities without difficulty  Other:    Medical Decision Making  Medically screening exam initiated at 9:16 PM.  Appropriate orders placed.  Clemence Treu was informed that the remainder of the evaluation will be completed by another provider, this initial triage assessment does not replace that evaluation, and the importance of remaining in the ED until their evaluation is complete.     Letitia Libra 10/26/20 2117    2118, MD 10/28/20 1455

## 2020-10-27 NOTE — ED Provider Notes (Signed)
[facility]-EMERGENCY DEPT Provider Note   CSN: [id number] Arrival date & time: 10/26/20  2019     History Chief Complaint  Patient presents with   Chest Pain    Amy Chen is a 40 y.o. female.  Patient to ED for evaluation of chest pain described as sharp, fleeting, lasting seconds before resolving. Symptoms started 5 days ago. It starts on one side of her chest and crosses to the opposite side then resolves. No identified pattern of onset. No SOB, cough, fever, nausea, vomiting or abdominal pain. No pain now.   The history is provided by the patient. No language interpreter was used.  Chest Pain Associated symptoms: no abdominal pain, no back pain, no cough, no fatigue, no fever, no nausea and no shortness of breath       History reviewed. No pertinent past medical history.  There are no problems to display for this patient.   History reviewed. No pertinent surgical history.   OB History   No obstetric history on file.     History reviewed. No pertinent family history.  Social History   Tobacco Use   Smoking status: Never   Smokeless tobacco: Never  Substance Use Topics   Alcohol use: Yes   Drug use: Yes    Types: Marijuana    Home Medications Prior to Admission medications   Medication Sig Start Date End Date Taking? Authorizing Provider  acetaminophen (TYLENOL) 325 MG tablet Take 975 mg by mouth every 6 (six) hours as needed for mild pain, moderate pain or headache.    [provider]  HYDROcodone-acetaminophen (NORCO/VICODIN) 5-325 MG tablet Take 1 tablet by mouth every 4 (four) hours as needed. 08/27/18   Henderly, Britni A, PA-C  ibuprofen (ADVIL) 200 MG tablet Take 400 mg by mouth every 6 (six) hours as needed for fever, headache or moderate pain.    [provider]  metroNIDAZOLE (FLAGYL) 500 MG tablet Take 1 tablet (500 mg total) by mouth 2 (two) times daily. 08/27/18   Henderly, Britni A, PA-C  Multiple  Vitamin (MULTIVITAMIN WITH MINERALS) TABS tablet Take 1 tablet by mouth daily.    [provider]  ondansetron (ZOFRAN ODT) 4 MG disintegrating tablet Take 1 tablet (4 mg total) by mouth every 8 (eight) hours as needed for nausea or vomiting. 08/27/18   Henderly, Britni A, PA-C    Allergies    Patient has no known allergies.  Review of Systems   Review of Systems  Constitutional:  Negative for fatigue and fever.  HENT: Negative.    Respiratory: Negative.  Negative for cough and shortness of breath.   Cardiovascular:  Positive for chest pain.  Gastrointestinal:  Negative for abdominal pain and nausea.  Musculoskeletal:  Negative for back pain.  Skin: Negative.    Physical Exam Updated Vital Signs BP 125/90   Pulse 64   Temp 98.3 F (36.8 C) (Oral)   Resp 19   Ht 5\' 7"  (1.702 m)   Wt 128 kg   LMP 10/18/2020   SpO2 99%   BMI 44.20 kg/m   Physical Exam Vitals and nursing note reviewed.  Constitutional:      Appearance: She is well-developed.  HENT:     Head: Normocephalic.  Cardiovascular:     Rate and Rhythm: Normal rate and regular rhythm.     Heart sounds: No murmur heard. Pulmonary:     Effort: Pulmonary effort is normal.     Breath sounds: Normal breath sounds. No  wheezing, rhonchi or rales.  Chest:     Chest wall: No tenderness.  Abdominal:     General: Bowel sounds are normal.     Palpations: Abdomen is soft.     Tenderness: There is no abdominal tenderness. There is no guarding or rebound.  Musculoskeletal:        General: Normal range of motion.     Cervical back: Normal range of motion and neck supple.     Right lower leg: No edema.     Left lower leg: No edema.  Skin:    General: Skin is warm and dry.  Neurological:     General: No focal deficit present.     Mental Status: She is alert and oriented to person, place, and time.    ED Results / Procedures / Treatments   Labs (all labs ordered are listed, but only abnormal results are  displayed) Labs Reviewed  BASIC METABOLIC PANEL - Abnormal; Notable for the following components:      Result Value   Glucose, Bld 119 (*)    All other components within normal limits  CBC  I-STAT BETA HCG BLOOD, ED ([facility], [facility], [facility] ONLY)  TROPONIN I (HIGH SENSITIVITY)  TROPONIN I (HIGH SENSITIVITY)   Results for orders placed or performed during the hospital encounter of 10/26/20  Basic metabolic panel  Result Value Ref Range   Sodium 135 135 - 145 mmol/L   Potassium 3.7 3.5 - 5.1 mmol/L   Chloride 103 98 - 111 mmol/L   CO2 24 22 - 32 mmol/L   Glucose, Bld 119 (H) 70 - 99 mg/dL   BUN 10 6 - 20 mg/dL   Creatinine, Ser 2.72 0.44 - 1.00 mg/dL   Calcium 9.1 8.9 - 53.6 mg/dL   GFR, Estimated >64 >40 mL/min   Anion gap 8 5 - 15  CBC  Result Value Ref Range   WBC 8.3 4.0 - 10.5 K/uL   RBC 4.44 3.87 - 5.11 MIL/uL   Hemoglobin 12.2 12.0 - 15.0 g/dL   HCT 34.7 42.5 - 95.6 %   MCV 86.3 80.0 - 100.0 fL   MCH 27.5 26.0 - 34.0 pg   MCHC 31.9 30.0 - 36.0 g/dL   RDW 38.7 56.4 - 33.2 %   Platelets 378 150 - 400 K/uL   nRBC 0.0 0.0 - 0.2 %  I-Stat beta hCG blood, ED  Result Value Ref Range   I-stat hCG, quantitative <5.0 <5 mIU/mL   Comment 3          Troponin I (High Sensitivity)  Result Value Ref Range   Troponin I (High Sensitivity) 2 <18 ng/L     EKG EKG Interpretation  Date/Time:  Monday October 26 2020 21:10:45 EDT Ventricular Rate:  74 PR Interval:  190 QRS Duration: 98 QT Interval:  382 QTC Calculation: 424 R Axis:   37 Text Interpretation: Sinus rhythm Nonspecific T abnormalities, anterior leads No old tracing to compare Confirmed by Pricilla Loveless [phone]) on 10/26/2020 9:13:16 PM  Radiology DG Chest 2 View  Result Date: 10/26/2020 CLINICAL DATA:  Chest pain EXAM: CHEST - 2 VIEW COMPARISON:  None. FINDINGS: Lungs are clear.  No pleural effusion or pneumothorax. The heart is normal in size. Visualized osseous structures are within normal limits. IMPRESSION: Normal  chest radiographs. Electronically Signed   By: Charline Bills M.D.   On: 10/26/2020 22:16    Procedures Procedures   Medications Ordered in ED Medications - No data to display  ED Course  I have reviewed the triage vital signs and the nursing notes.  Pertinent labs & imaging results that were available during my care of the patient were reviewed by me and considered in my medical decision making (see chart for details).    MDM Rules/Calculators/A&P                           Patient to ED with chest pain as detailed in the HPI.   Labs, EKG, CXR are unremarkable in evaluation of very nonspecific symptoms of chest pain in otherwise healthy patient. She is stable for discharge home without evidence to suggest PE, dissection, ACS, infection.   Final Clinical Impression(s) / ED Diagnoses Final diagnoses:  None   Nonspecific chest pain Rx / DC Orders ED Discharge Orders     None        Elpidio Anis, PA-C 10/27/20 0549    Molpus, Jonny Ruiz, MD 10/27/20 [phone]

## 2020-10-27 NOTE — Discharge Instructions (Addendum)
Follow up with a primary care provider of your choice for further outpatient evaluation of chest pain. If you do not have a primary care provider, you can call the [facility] to make an appointment.   Return to the ED with any new or worsening symptoms at any time.

## 2022-12-15 IMAGING — CR DG CHEST 2V
2 series · 2 of 2 positions shown · non-contrast
Comparison: None.

CLINICAL DATA: Chest pain

EXAM:
CHEST - 2 VIEW

[w chest pa]
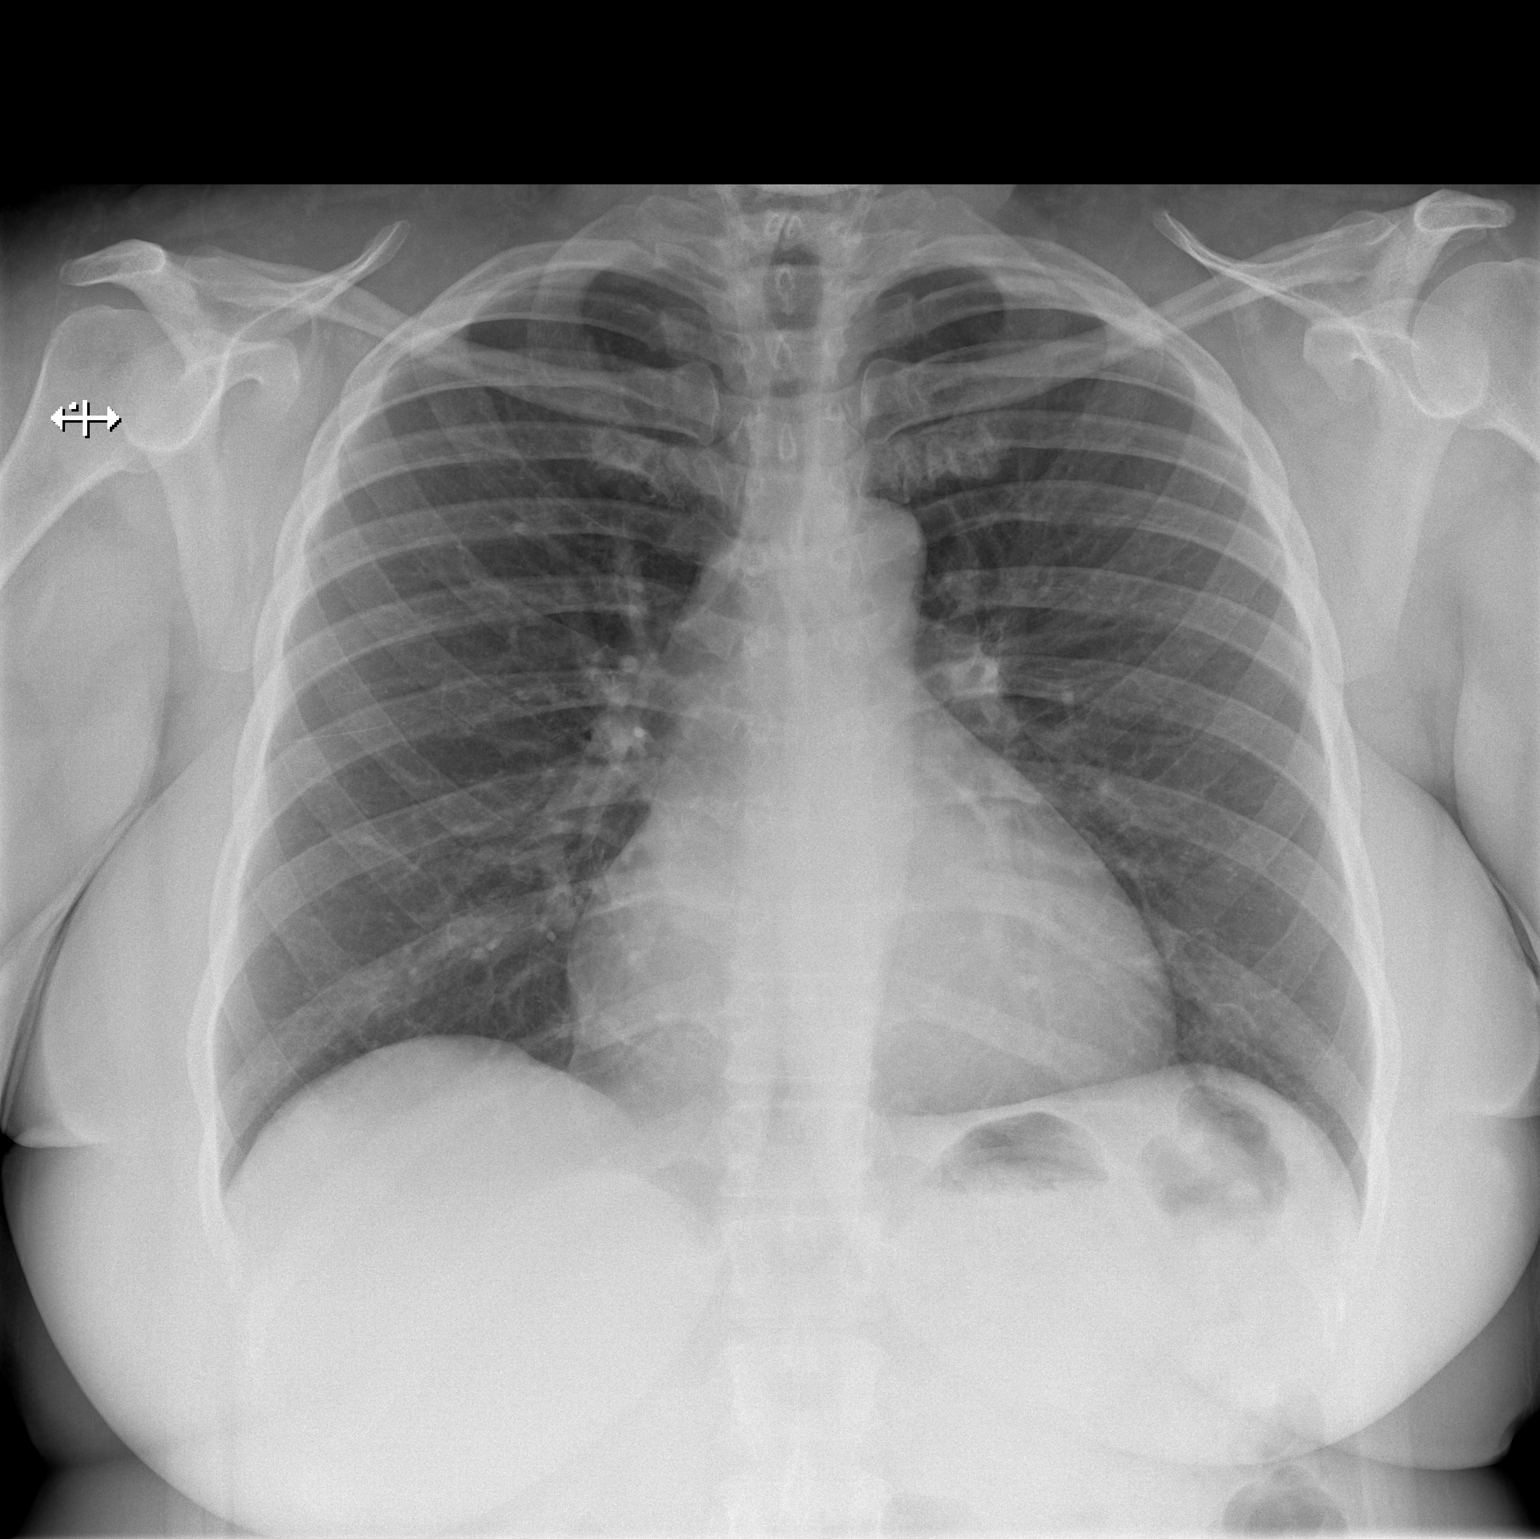

[w chest lat]
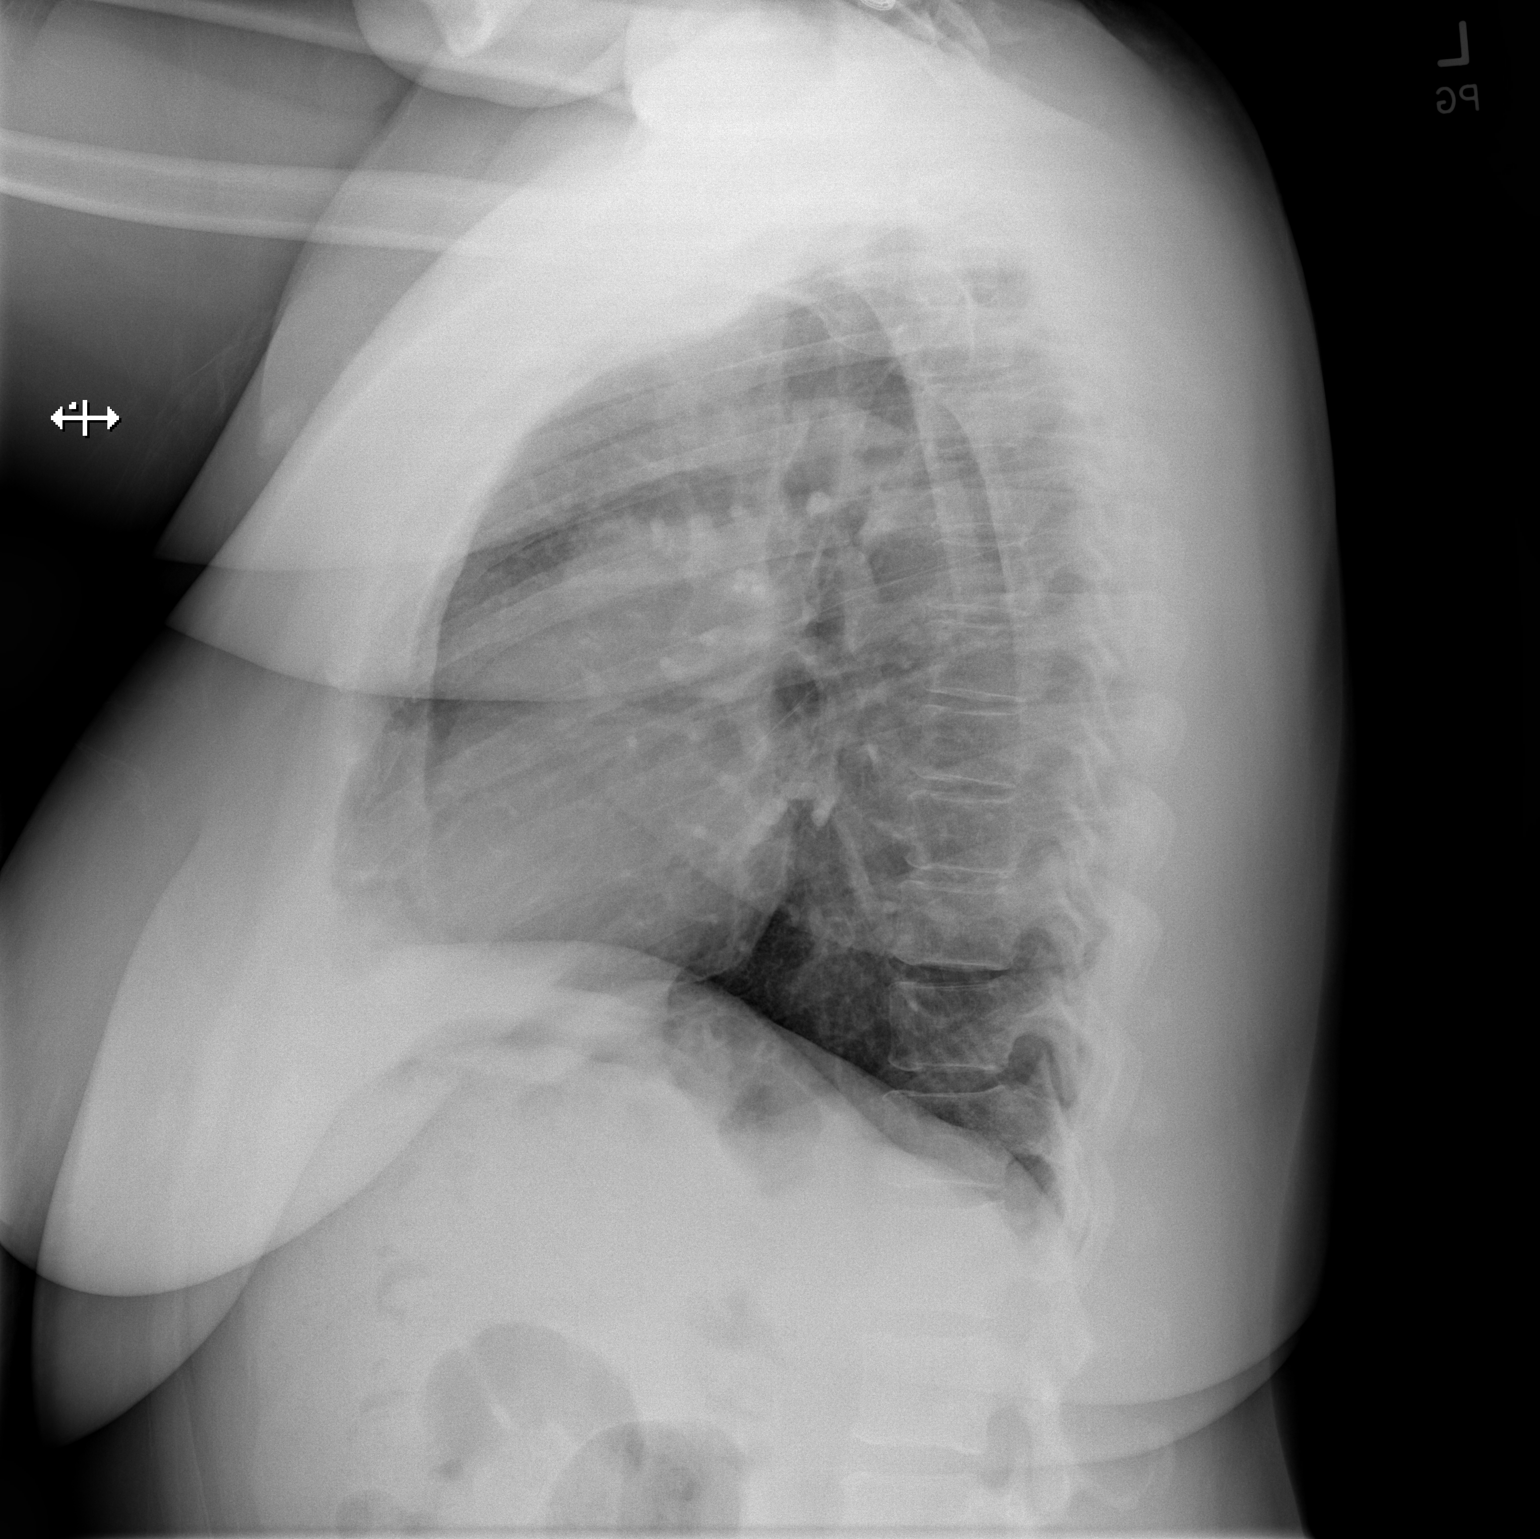

[2 of 2 positions shown; findings below may reference images not displayed]

FINDINGS: Lungs are clear.  No pleural effusion or pneumothorax.

The heart is normal in size.

Visualized osseous structures are within normal limits.
IMPRESSION: Normal chest radiographs.
